# Patient Record
Sex: Male | Born: 2000 | Race: White | Hispanic: No | Marital: Single | Smoking: Never smoker
Health system: Southern US, Community
[De-identification: ages and names within clinical notes are randomized; demographics above are authoritative.]

## PROBLEM LIST (undated history)

## (undated) HISTORY — PX: FINGER SURGERY: SHX640

---
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
## 2023-04-13 ENCOUNTER — Ambulatory Visit (HOSPITAL_COMMUNITY): Payer: Self-pay | Attending: Pain Medicine | Admitting: Occupational Therapy

## 2023-04-13 ENCOUNTER — Encounter (HOSPITAL_COMMUNITY): Payer: Self-pay | Admitting: Occupational Therapy

## 2023-04-13 ENCOUNTER — Other Ambulatory Visit: Payer: Self-pay

## 2023-04-13 DIAGNOSIS — M79642 Pain in left hand: Secondary | ICD-10-CM | POA: Diagnosis present

## 2023-04-13 DIAGNOSIS — M79641 Pain in right hand: Secondary | ICD-10-CM | POA: Insufficient documentation

## 2023-04-13 DIAGNOSIS — R29898 Other symptoms and signs involving the musculoskeletal system: Secondary | ICD-10-CM | POA: Diagnosis present

## 2023-04-13 NOTE — Therapy (Signed)
 OUTPATIENT OCCUPATIONAL THERAPY ORTHO EVALUATION  Patient Name: Danny Friedman MRN: [id number] DOB:05-Apr-2000, 23 y.o., male Today's Date: 04/13/2023    END OF SESSION:  OT End of Session - 04/13/23 1152     Visit Number 1    Number of Visits 1    Date for OT Re-Evaluation 04/16/23    Authorization Type [facility]    Authorization Time Period 15 visits approved    Authorization - Visit Number 1    Authorization - Number of Visits 15    OT Start Time 1101    OT Stop Time 1135    OT Time Calculation (min) 34 min    Activity Tolerance Patient tolerated treatment well    Behavior During Therapy WFL for tasks assessed/performed             History reviewed. No pertinent past medical history. Past Surgical History:  Procedure Laterality Date   FINGER SURGERY     There are no active problems to display for this patient.  PCP: Dr. Assunta Found  REFERRING PROVIDER: Dr. Raford Pitcher  ONSET DATE: 3-4 months ago  REFERRING DIAG: M25.549 (ICD-10-CM) - Pain in joints of unspecified hand   THERAPY DIAG:  Pain in left hand  Pain in right hand  Other symptoms and signs involving the musculoskeletal system  Rationale for Evaluation and Treatment: Rehabilitation  SUBJECTIVE:   SUBJECTIVE STATEMENT: S: Sometimes I can't even write.  Pt accompanied by: self  PERTINENT HISTORY: Pt is a 23 y/o male presenting with bilateral hand pain and weakness present for approximately 3-4 months. No known cause. X-rays negative for acute changes, no additional imaging.   PRECAUTIONS: None  WEIGHT BEARING RESTRICTIONS: No  PAIN:  Are you having pain? Yes: NPRS scale: 3/10 Pain location: fingers of bilateral hands Pain description: aching Aggravating factors: cold Relieving factors: warm weather  FALLS: Has patient fallen in last 6 months? No  PLOF: Independent  PATIENT GOALS: To have more strength.  OBJECTIVE:  Note: Objective measures were completed at Evaluation unless otherwise  noted.  HAND DOMINANCE: Right  ADLs: The biggest issue is writing, fatigues easily even when feeling good. Sometimes cannot write. Works at a [employer], [employer] and has to write sometimes.   FUNCTIONAL OUTCOME MEASURES: Quick Dash: 25   HAND FUNCTION: Grip strength: Right: 50 lbs; Left: 62 lbs, Lateral pinch: Right: 10 lbs, Left: 16 lbs, and 3 point pinch: Right: 11 lbs, Left: 16 lbs  SENSATION: WFL  COGNITION: Overall cognitive status: Within functional limits for tasks assessed  OBSERVATIONS: Pt with cyst like nodule on dorsal left wrist immediately distal to distal radius. Negative testing for carpal tunnel or De Quervain's tenosynovitis.    TREATMENT DATE:  Eval: -Educated on HEP and observations. Discussed pain management for arthritis including ROM under warm water, gloves when cold, wearing hand warmers in gloves or pockets when very cold.                                                                                                                         -  Trialed various pencil grips and sent with egg grip to try during handwriting tasks.     PATIENT EDUCATION: Education details: theraputty for grip and pinch strengthening-red Person educated: Patient Education method: Explanation, Demonstration, and Handouts Education comprehension: verbalized understanding and returned demonstration  HOME EXERCISE PROGRAM: Eval:  theraputty for grip and pinch strengthening-red  GOALS: Goals reviewed with patient? Yes  SHORT TERM GOALS: Target date: 04/13/23  Pt will be provided with and educated on HEP to improve bilateral grip strength required for ADL completion.  Goal status: INITIAL    ASSESSMENT:  CLINICAL IMPRESSION: Patient is a 23 y.o. male who was seen today for occupational therapy evaluation for bilateral hand weakness and pain. 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 Pt with decreased grip and pinch strength in bilateral hands  compared to normative data. Mostly bothers patient when he is writing. Trialed various grips, most successful with egg grip and provided for pt to trial at home. Provided HEP for grip and pinch strength, as well as recommendations for arthritic pain management. Pt agreeable to HEP, no further OT needs at this time.   Pt noted to have cyst-like nodule on left dorsal wrist, occasionally causing pain. Soft to touch but unable to move in any direction. Recommend following up with dermatologist or hand specialist for further assessment.   PERFORMANCE DEFICITS: in functional skills including ADLs, IADLs, strength, pain, and UE functional use  IMPAIRMENTS: are limiting patient from ADLs, IADLs, work, and leisure.   COMORBIDITIES: has no other co-morbidities that affects occupational performance. Patient will benefit from skilled OT to address above impairments and improve overall function.  MODIFICATION OR ASSISTANCE TO COMPLETE EVALUATION: No modification of tasks or assist necessary to complete an evaluation.  OT OCCUPATIONAL PROFILE AND HISTORY: Problem focused assessment: Including review of records relating to presenting problem.  CLINICAL DECISION MAKING: LOW - limited treatment options, no task modification necessary  REHAB POTENTIAL: Good  EVALUATION COMPLEXITY: Low      PLAN:  OT FREQUENCY: one time visit  OT DURATION: 1 week  PLANNED INTERVENTIONS: 97168 OT Re-evaluation, 97535 self care/ADL training, 02725 therapeutic exercise, 97530 therapeutic activity, patient/family education, and DME and/or AE instructions  RECOMMENDED OTHER SERVICES: None at this time  CONSULTED AND AGREED WITH PLAN OF CARE: Patient  PLAN FOR NEXT SESSION: N/A-eval only at this time   Ezra Sites, OTR/L  [phone] 04/13/2023, 11:53 AM

## 2023-04-13 NOTE — Patient Instructions (Signed)
Home Exercises Program Theraputty Exercises  Do the following exercises 2-3 times a day using your affected hand.  1. Roll putty into a ball.  2. Make into a pancake.  3. Roll putty into a roll.  4. Pinch along log with first finger and thumb.   5. Make into a ball.  6. Roll it back into a log.   7. Pinch using thumb and side of first finger.  8. Roll into a ball, then flatten into a pancake.  9. Using your fingers, make putty into a mountain.  10. Roll putty back into a ball and squeeze gently for 2-3 minutes.
